# Patient Record
Sex: Male | Born: 1935 | Race: White | Hispanic: No | Marital: Married | Smoking: Never smoker
Health system: Southern US, Community
[De-identification: ages and names within clinical notes are randomized; demographics above are authoritative.]

## PROBLEM LIST (undated history)

## (undated) DIAGNOSIS — I10 Essential (primary) hypertension: Secondary | ICD-10-CM

---

## 2007-10-08 ENCOUNTER — Emergency Department (HOSPITAL_COMMUNITY): Admission: EM | Admit: 2007-10-08 | Discharge: 2007-10-08 | Payer: Self-pay | Admitting: Emergency Medicine

## 2009-04-17 IMAGING — CT CT PELVIS W/O CM
2 of 4 series · 17 of 46 positions shown, 19 images · non-contrast
Comparison: None

CT ABDOMEN

CLINICAL DATA: Right flank pain

CT OF THE ABDOMEN AND PELVIS WITHOUT CONTRAST (CT UROGRAM)
TECHNIQUE: Multidetector CT imaging was performed through the
abdomen and pelvis to include the urinary tract.

[Series 2: stone_wo 5.0 b40f st · axial · 0.67mm/px · z∈[-264,+136]mm · 14 of 110 slices shown, 16 images]
[im 5/110  soft-tissue]
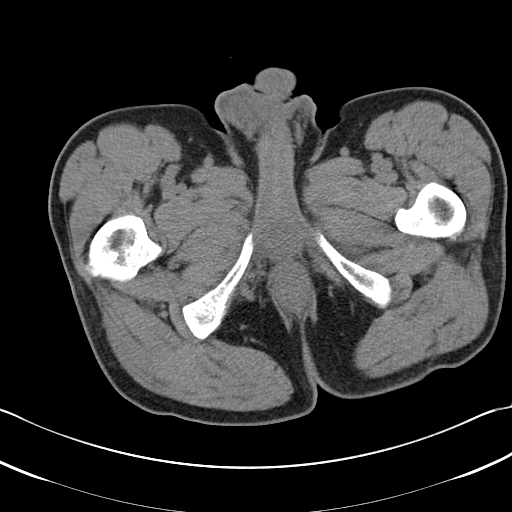
[im 5/110  bone]
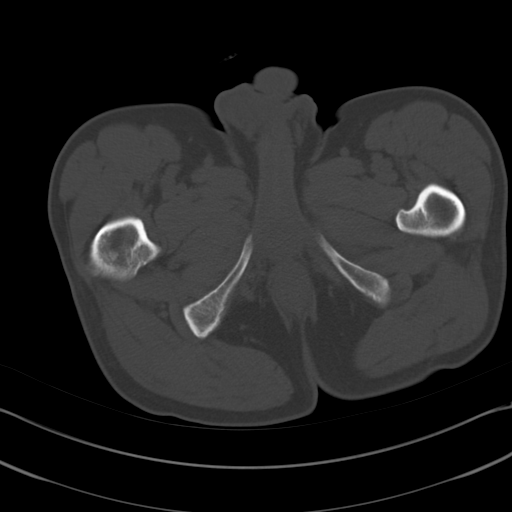
[im 13/110  soft-tissue]
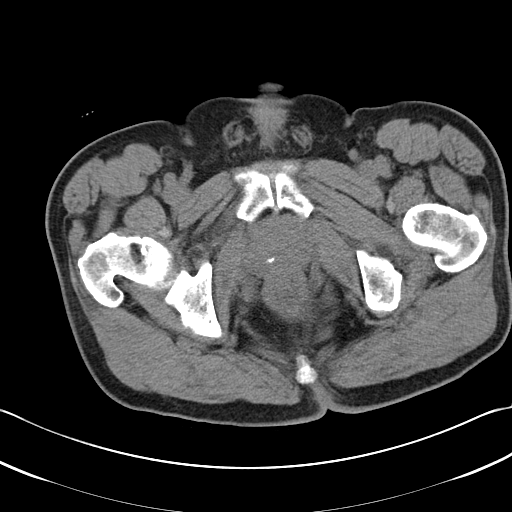
[im 21/110  soft-tissue]
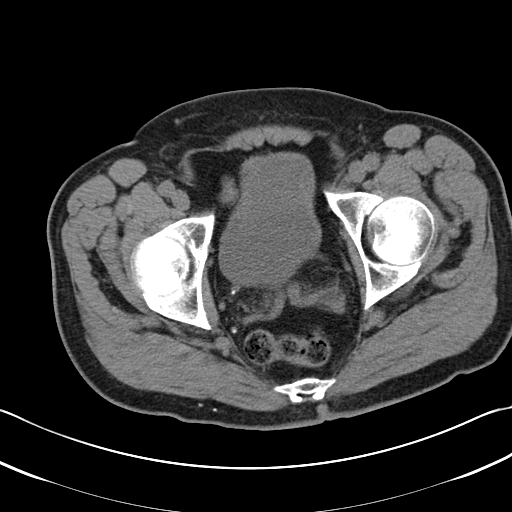
[im 29/110  soft-tissue]
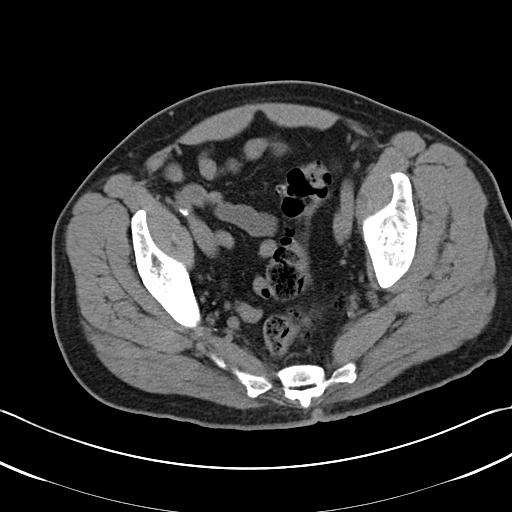
[im 37/110  soft-tissue]
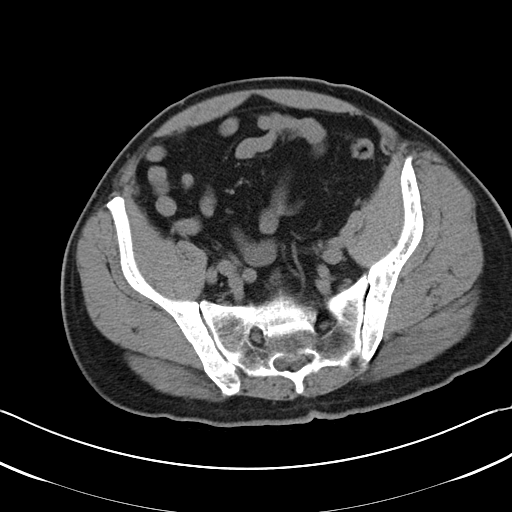
[im 45/110  soft-tissue]
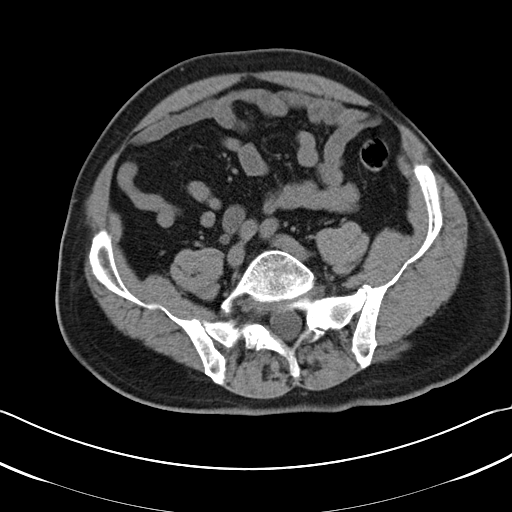
[im 53/110  soft-tissue]
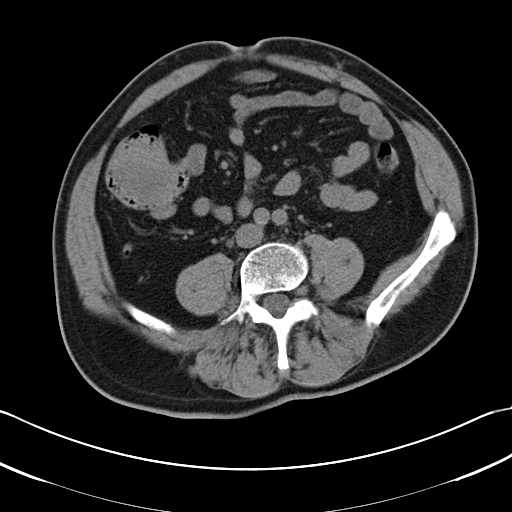
[im 57/110  soft-tissue]
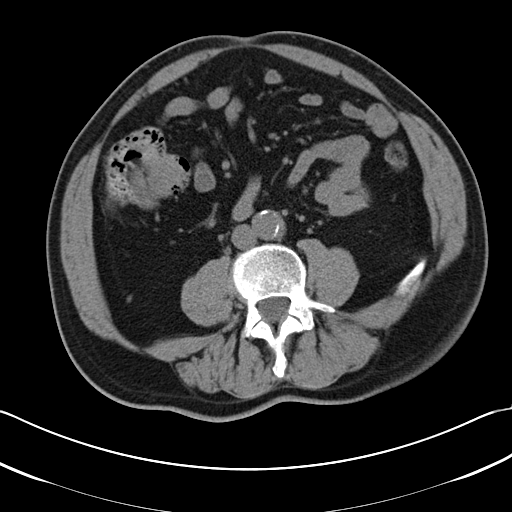
[im 65/110  soft-tissue]
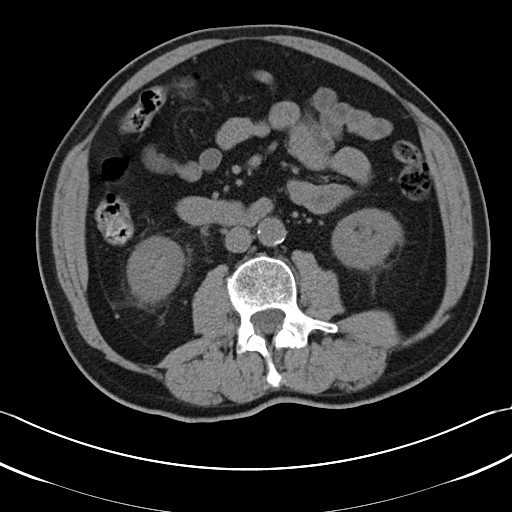
[im 65/110  bone]
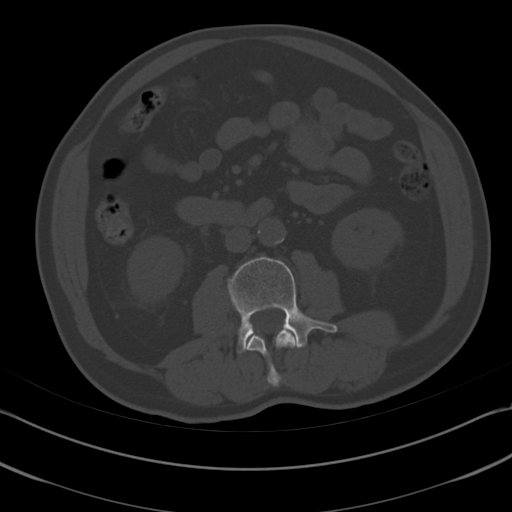
[im 73/110  soft-tissue]
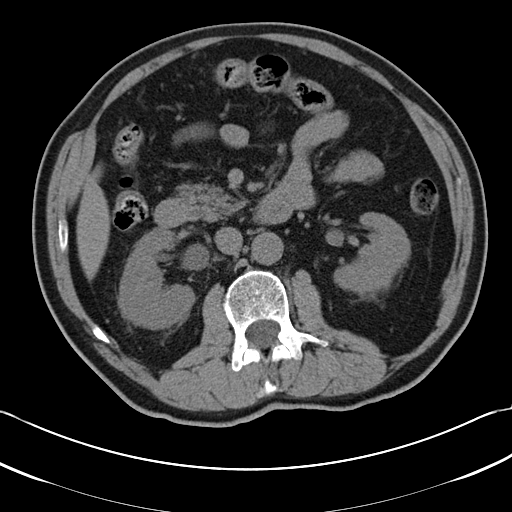
[im 81/110  soft-tissue]
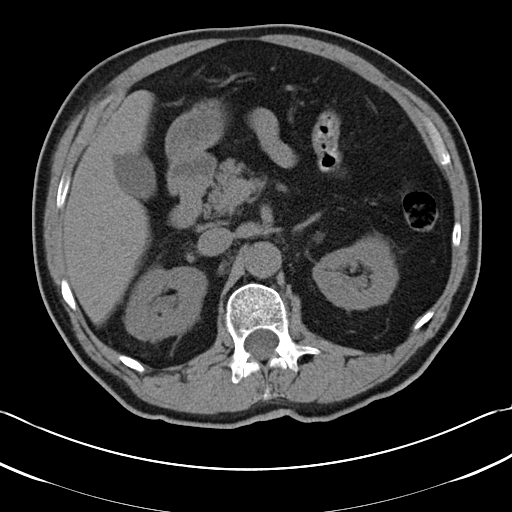
[im 89/110  soft-tissue]
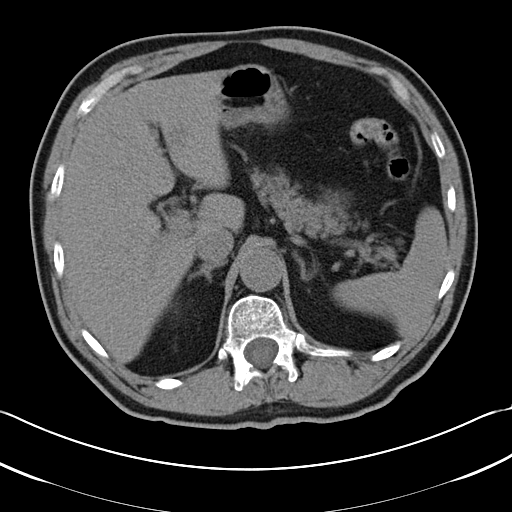
[im 97/110  soft-tissue]
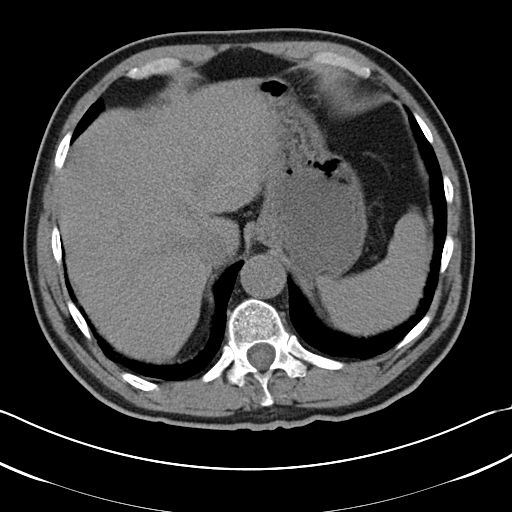
[im 105/110  soft-tissue]
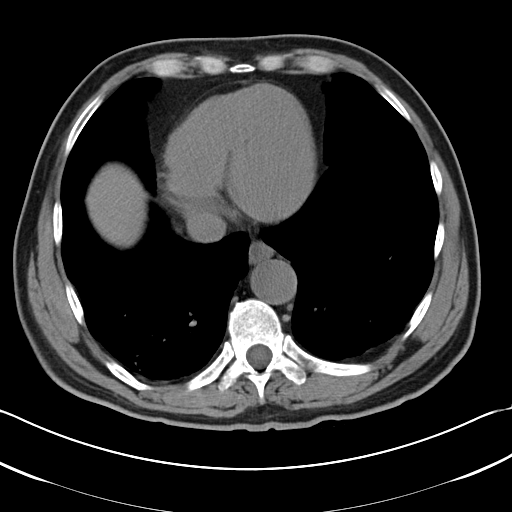

[Series 602: coronal · coronal · 0.89mm/px · 3 of 77 slices shown]
[im 26/77  soft-tissue]
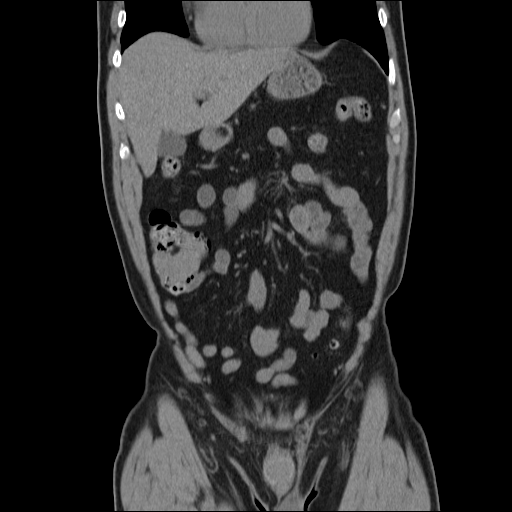
[im 34/77  soft-tissue]
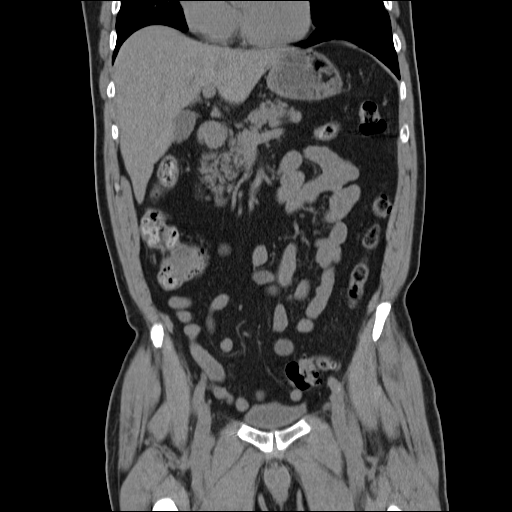
[im 43/77  soft-tissue]
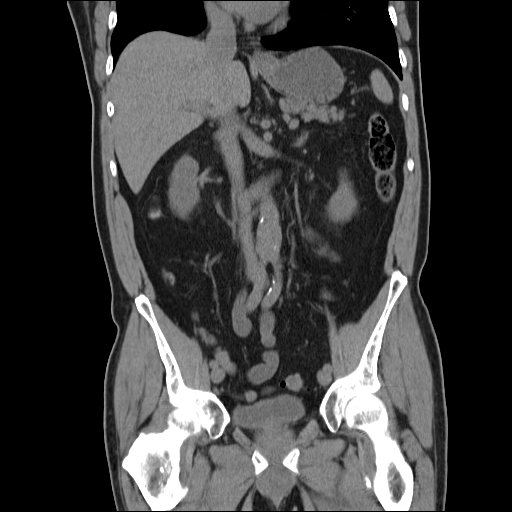

[17 of 46 positions shown; findings below may reference images not displayed]

FINDINGS: There is mild right hydronephrosis.  Perinephric
stranding present.  Small nonobstructing stones in the mid pole of
the right kidney.  No hydronephrosis on the left.  There is mild
perinephric stranding on the left, likely related to remote insult.

Solid organs unremarkable unenhanced appearance.

Linear densities in the lung bases compatible with scarring or
atelectasis.  No effusions.

Gallbladder and bowel grossly unremarkable.  No free fluid, free
air, or adenopathy.
IMPRESSION: Mild right hydronephrosis.  Small nonobstructing right mid pole
stone.  Bilateral perinephric stranding.

CT PELVIS
FINDINGS: There is a 3 mm right UVJ stone.  Right ureter is mildly
dilated to the stone.  No stones on the left.  Urinary bladder
unremarkable.  Appendix is visualized and is normal.  There is
sigmoid diverticulosis.  Small bowel grossly unremarkable. Prostate
mildly enlarged.
IMPRESSION: 3 mm right UVJ stone.

Prostate enlargement.

Sigmoid diverticulosis.

## 2011-02-04 LAB — URINALYSIS, ROUTINE W REFLEX MICROSCOPIC
Ketones, ur: NEGATIVE
Leukocytes, UA: NEGATIVE
Nitrite: NEGATIVE
Specific Gravity, Urine: 1.02
Urobilinogen, UA: 0.2
pH: 6

## 2011-02-04 LAB — POCT I-STAT, CHEM 8
Creatinine, Ser: 1.4
HCT: 45
Hemoglobin: 15.3
Potassium: 4.7
Sodium: 142
TCO2: 25

## 2011-02-04 LAB — CBC
HCT: 44.8
MCV: 91.7
Platelets: 286
RDW: 13.5
WBC: 11 — ABNORMAL HIGH

## 2011-02-04 LAB — DIFFERENTIAL
Basophils Absolute: 0
Basophils Relative: 0
Eosinophils Absolute: 0
Eosinophils Relative: 0
Lymphs Abs: 0.8
Neutrophils Relative %: 89 — ABNORMAL HIGH

## 2013-08-02 ENCOUNTER — Encounter (INDEPENDENT_AMBULATORY_CARE_PROVIDER_SITE_OTHER): Payer: Medicare Other | Admitting: Ophthalmology

## 2013-08-02 DIAGNOSIS — H43819 Vitreous degeneration, unspecified eye: Secondary | ICD-10-CM

## 2013-08-02 DIAGNOSIS — H35379 Puckering of macula, unspecified eye: Secondary | ICD-10-CM

## 2013-08-02 DIAGNOSIS — H5340 Unspecified visual field defects: Secondary | ICD-10-CM

## 2013-08-02 DIAGNOSIS — I10 Essential (primary) hypertension: Secondary | ICD-10-CM

## 2013-08-02 DIAGNOSIS — H35039 Hypertensive retinopathy, unspecified eye: Secondary | ICD-10-CM

## 2013-08-02 DIAGNOSIS — H2510 Age-related nuclear cataract, unspecified eye: Secondary | ICD-10-CM

## 2015-04-23 ENCOUNTER — Other Ambulatory Visit: Payer: Self-pay | Admitting: Family Medicine

## 2015-04-23 DIAGNOSIS — Z87891 Personal history of nicotine dependence: Secondary | ICD-10-CM

## 2015-04-26 ENCOUNTER — Telehealth: Payer: Self-pay | Admitting: Acute Care

## 2015-04-26 NOTE — Telephone Encounter (Signed)
Received paperwork from Dr. Sigmund HazelLisa Miller to schedule the Lung Screening Program. However, because of the patient's age, he will not quailfy for the program.  Left a message on the patient's Voicemail to inform him of the information. Nothing further needed at this time.

## 2016-06-03 ENCOUNTER — Other Ambulatory Visit: Payer: Self-pay | Admitting: Family Medicine

## 2016-06-03 DIAGNOSIS — Z720 Tobacco use: Secondary | ICD-10-CM

## 2016-06-15 ENCOUNTER — Other Ambulatory Visit: Payer: Self-pay | Admitting: Family Medicine

## 2016-06-15 ENCOUNTER — Ambulatory Visit
Admission: RE | Admit: 2016-06-15 | Discharge: 2016-06-15 | Disposition: A | Discharge status: Home / Self Care | Payer: Medicare Other | Source: Ambulatory Visit | Attending: Family Medicine | Admitting: Family Medicine

## 2016-06-15 DIAGNOSIS — Z720 Tobacco use: Secondary | ICD-10-CM

## 2020-06-14 DIAGNOSIS — L600 Ingrowing nail: Secondary | ICD-10-CM | POA: Diagnosis not present

## 2020-06-14 DIAGNOSIS — M79674 Pain in right toe(s): Secondary | ICD-10-CM | POA: Diagnosis not present

## 2020-06-14 DIAGNOSIS — B351 Tinea unguium: Secondary | ICD-10-CM | POA: Diagnosis not present

## 2020-06-14 DIAGNOSIS — B353 Tinea pedis: Secondary | ICD-10-CM | POA: Diagnosis not present

## 2020-06-14 DIAGNOSIS — M79675 Pain in left toe(s): Secondary | ICD-10-CM | POA: Diagnosis not present

## 2020-07-09 DIAGNOSIS — Z0000 Encounter for general adult medical examination without abnormal findings: Secondary | ICD-10-CM | POA: Diagnosis not present

## 2020-07-12 DIAGNOSIS — L600 Ingrowing nail: Secondary | ICD-10-CM | POA: Diagnosis not present

## 2020-07-12 DIAGNOSIS — M79674 Pain in right toe(s): Secondary | ICD-10-CM | POA: Diagnosis not present

## 2020-07-12 DIAGNOSIS — B351 Tinea unguium: Secondary | ICD-10-CM | POA: Diagnosis not present

## 2020-07-12 DIAGNOSIS — M79675 Pain in left toe(s): Secondary | ICD-10-CM | POA: Diagnosis not present

## 2020-08-14 DIAGNOSIS — I10 Essential (primary) hypertension: Secondary | ICD-10-CM | POA: Diagnosis not present

## 2020-08-14 DIAGNOSIS — Z8719 Personal history of other diseases of the digestive system: Secondary | ICD-10-CM | POA: Diagnosis not present

## 2020-08-14 DIAGNOSIS — I714 Abdominal aortic aneurysm, without rupture: Secondary | ICD-10-CM | POA: Diagnosis not present

## 2020-08-14 DIAGNOSIS — I77819 Aortic ectasia, unspecified site: Secondary | ICD-10-CM | POA: Diagnosis not present

## 2020-08-16 DIAGNOSIS — B351 Tinea unguium: Secondary | ICD-10-CM | POA: Diagnosis not present

## 2020-08-16 DIAGNOSIS — M79674 Pain in right toe(s): Secondary | ICD-10-CM | POA: Diagnosis not present

## 2020-08-16 DIAGNOSIS — M79675 Pain in left toe(s): Secondary | ICD-10-CM | POA: Diagnosis not present

## 2020-08-16 DIAGNOSIS — L600 Ingrowing nail: Secondary | ICD-10-CM | POA: Diagnosis not present

## 2020-09-13 DIAGNOSIS — M79675 Pain in left toe(s): Secondary | ICD-10-CM | POA: Diagnosis not present

## 2020-09-13 DIAGNOSIS — B351 Tinea unguium: Secondary | ICD-10-CM | POA: Diagnosis not present

## 2020-09-13 DIAGNOSIS — M79674 Pain in right toe(s): Secondary | ICD-10-CM | POA: Diagnosis not present

## 2020-09-13 DIAGNOSIS — L600 Ingrowing nail: Secondary | ICD-10-CM | POA: Diagnosis not present

## 2020-10-29 DIAGNOSIS — R3914 Feeling of incomplete bladder emptying: Secondary | ICD-10-CM | POA: Diagnosis not present

## 2020-10-29 DIAGNOSIS — R3912 Poor urinary stream: Secondary | ICD-10-CM | POA: Diagnosis not present

## 2020-12-02 DIAGNOSIS — H6122 Impacted cerumen, left ear: Secondary | ICD-10-CM | POA: Diagnosis not present

## 2020-12-06 DIAGNOSIS — I10 Essential (primary) hypertension: Secondary | ICD-10-CM | POA: Diagnosis not present

## 2020-12-13 DIAGNOSIS — L600 Ingrowing nail: Secondary | ICD-10-CM | POA: Diagnosis not present

## 2021-03-14 DIAGNOSIS — M79675 Pain in left toe(s): Secondary | ICD-10-CM | POA: Diagnosis not present

## 2021-03-14 DIAGNOSIS — L600 Ingrowing nail: Secondary | ICD-10-CM | POA: Diagnosis not present

## 2021-03-14 DIAGNOSIS — B351 Tinea unguium: Secondary | ICD-10-CM | POA: Diagnosis not present

## 2021-03-14 DIAGNOSIS — M79674 Pain in right toe(s): Secondary | ICD-10-CM | POA: Diagnosis not present

## 2021-06-04 DIAGNOSIS — H35373 Puckering of macula, bilateral: Secondary | ICD-10-CM | POA: Diagnosis not present

## 2021-06-04 DIAGNOSIS — H5211 Myopia, right eye: Secondary | ICD-10-CM | POA: Diagnosis not present

## 2021-06-04 DIAGNOSIS — H2640 Unspecified secondary cataract: Secondary | ICD-10-CM | POA: Diagnosis not present

## 2021-06-04 DIAGNOSIS — H40013 Open angle with borderline findings, low risk, bilateral: Secondary | ICD-10-CM | POA: Diagnosis not present

## 2021-06-04 DIAGNOSIS — H01003 Unspecified blepharitis right eye, unspecified eyelid: Secondary | ICD-10-CM | POA: Diagnosis not present

## 2021-06-04 DIAGNOSIS — H01006 Unspecified blepharitis left eye, unspecified eyelid: Secondary | ICD-10-CM | POA: Diagnosis not present

## 2021-06-04 DIAGNOSIS — H35033 Hypertensive retinopathy, bilateral: Secondary | ICD-10-CM | POA: Diagnosis not present

## 2021-06-13 DIAGNOSIS — B351 Tinea unguium: Secondary | ICD-10-CM | POA: Diagnosis not present

## 2021-06-13 DIAGNOSIS — M79675 Pain in left toe(s): Secondary | ICD-10-CM | POA: Diagnosis not present

## 2021-06-13 DIAGNOSIS — L600 Ingrowing nail: Secondary | ICD-10-CM | POA: Diagnosis not present

## 2021-06-13 DIAGNOSIS — M79674 Pain in right toe(s): Secondary | ICD-10-CM | POA: Diagnosis not present

## 2021-08-26 DIAGNOSIS — N1831 Chronic kidney disease, stage 3a: Secondary | ICD-10-CM | POA: Diagnosis not present

## 2021-08-26 DIAGNOSIS — I77819 Aortic ectasia, unspecified site: Secondary | ICD-10-CM | POA: Diagnosis not present

## 2021-08-26 DIAGNOSIS — Z8719 Personal history of other diseases of the digestive system: Secondary | ICD-10-CM | POA: Diagnosis not present

## 2021-08-26 DIAGNOSIS — I129 Hypertensive chronic kidney disease with stage 1 through stage 4 chronic kidney disease, or unspecified chronic kidney disease: Secondary | ICD-10-CM | POA: Diagnosis not present

## 2021-09-12 DIAGNOSIS — M79675 Pain in left toe(s): Secondary | ICD-10-CM | POA: Diagnosis not present

## 2021-09-12 DIAGNOSIS — B351 Tinea unguium: Secondary | ICD-10-CM | POA: Diagnosis not present

## 2021-09-12 DIAGNOSIS — M79674 Pain in right toe(s): Secondary | ICD-10-CM | POA: Diagnosis not present

## 2021-09-12 DIAGNOSIS — L600 Ingrowing nail: Secondary | ICD-10-CM | POA: Diagnosis not present

## 2021-10-31 DIAGNOSIS — R3914 Feeling of incomplete bladder emptying: Secondary | ICD-10-CM | POA: Diagnosis not present

## 2022-09-01 DIAGNOSIS — E7800 Pure hypercholesterolemia, unspecified: Secondary | ICD-10-CM | POA: Diagnosis not present

## 2022-09-01 DIAGNOSIS — H9191 Unspecified hearing loss, right ear: Secondary | ICD-10-CM | POA: Diagnosis not present

## 2022-09-01 DIAGNOSIS — Z6825 Body mass index (BMI) 25.0-25.9, adult: Secondary | ICD-10-CM | POA: Diagnosis not present

## 2022-09-01 DIAGNOSIS — I129 Hypertensive chronic kidney disease with stage 1 through stage 4 chronic kidney disease, or unspecified chronic kidney disease: Secondary | ICD-10-CM | POA: Diagnosis not present

## 2022-09-01 DIAGNOSIS — N1831 Chronic kidney disease, stage 3a: Secondary | ICD-10-CM | POA: Diagnosis not present

## 2022-09-01 DIAGNOSIS — I77819 Aortic ectasia, unspecified site: Secondary | ICD-10-CM | POA: Diagnosis not present

## 2022-09-01 DIAGNOSIS — F5101 Primary insomnia: Secondary | ICD-10-CM | POA: Diagnosis not present

## 2022-09-01 DIAGNOSIS — N400 Enlarged prostate without lower urinary tract symptoms: Secondary | ICD-10-CM | POA: Diagnosis not present

## 2022-09-08 ENCOUNTER — Encounter (HOSPITAL_COMMUNITY): Payer: Self-pay

## 2022-09-08 ENCOUNTER — Emergency Department (HOSPITAL_COMMUNITY)
Admission: EM | Admit: 2022-09-08 | Discharge: 2022-09-08 | Disposition: A | Discharge status: Home / Self Care | Payer: PPO | Attending: Emergency Medicine | Admitting: Emergency Medicine

## 2022-09-08 ENCOUNTER — Emergency Department (HOSPITAL_COMMUNITY): Payer: PPO

## 2022-09-08 ENCOUNTER — Other Ambulatory Visit: Payer: Self-pay

## 2022-09-08 DIAGNOSIS — I10 Essential (primary) hypertension: Secondary | ICD-10-CM | POA: Diagnosis not present

## 2022-09-08 DIAGNOSIS — R944 Abnormal results of kidney function studies: Secondary | ICD-10-CM | POA: Insufficient documentation

## 2022-09-08 DIAGNOSIS — K5900 Constipation, unspecified: Secondary | ICD-10-CM

## 2022-09-08 DIAGNOSIS — R109 Unspecified abdominal pain: Secondary | ICD-10-CM | POA: Diagnosis not present

## 2022-09-08 HISTORY — DX: Essential (primary) hypertension: I10

## 2022-09-08 LAB — CBC
HCT: 40.8 % (ref 39.0–52.0)
Hemoglobin: 14 g/dL (ref 13.0–17.0)
MCH: 29.5 pg (ref 26.0–34.0)
MCHC: 34.3 g/dL (ref 30.0–36.0)
MCV: 85.9 fL (ref 80.0–100.0)
Platelets: 269 10*3/uL (ref 150–400)
RBC: 4.75 MIL/uL (ref 4.22–5.81)
RDW: 13.2 % (ref 11.5–15.5)
WBC: 5.9 10*3/uL (ref 4.0–10.5)
nRBC: 0 % (ref 0.0–0.2)

## 2022-09-08 LAB — COMPREHENSIVE METABOLIC PANEL
ALT: 19 U/L (ref 0–44)
AST: 27 U/L (ref 15–41)
Albumin: 3.7 g/dL (ref 3.5–5.0)
Alkaline Phosphatase: 80 U/L (ref 38–126)
Anion gap: 8 (ref 5–15)
BUN: 14 mg/dL (ref 8–23)
CO2: 24 mmol/L (ref 22–32)
Calcium: 9.5 mg/dL (ref 8.9–10.3)
Chloride: 107 mmol/L (ref 98–111)
Creatinine, Ser: 1.32 mg/dL — ABNORMAL HIGH (ref 0.61–1.24)
GFR, Estimated: 53 mL/min — ABNORMAL LOW (ref >60)
Glucose, Bld: 107 mg/dL — ABNORMAL HIGH (ref 70–99)
Potassium: 4.3 mmol/L (ref 3.5–5.1)
Sodium: 139 mmol/L (ref 135–145)
Total Bilirubin: 0.7 mg/dL (ref 0.3–1.2)
Total Protein: 7.6 g/dL (ref 6.5–8.1)

## 2022-09-08 LAB — MAGNESIUM: Magnesium: 2.2 mg/dL (ref 1.7–2.4)

## 2022-09-08 MED ORDER — POLYETHYLENE GLYCOL 3350 17 G PO PACK
17.0000 g | PACK | Freq: Every day | ORAL | Status: DC
  Administered 2022-09-08: 17 g via ORAL
  Filled 2022-09-08: qty 1

## 2022-09-08 MED ORDER — BISACODYL 10 MG RE SUPP: 10.0000 mg | RECTAL | 0 refills | Status: DC | PRN

## 2022-09-08 MED ORDER — FLEET ENEMA 7-19 GM/118ML RE ENEM
1.0000 | ENEMA | Freq: Once | RECTAL | Status: AC
  Administered 2022-09-08: 1 via RECTAL
  Filled 2022-09-08: qty 1

## 2022-09-08 MED ORDER — POLYETHYLENE GLYCOL 3350 17 G PO PACK: 17.0000 g | PACK | Freq: Every day | ORAL | 0 refills | Status: DC

## 2022-09-08 MED ORDER — BISACODYL 10 MG RE SUPP: 10.0000 mg | RECTAL | 0 refills | Status: AC | PRN

## 2022-09-08 MED ORDER — POLYETHYLENE GLYCOL 3350 17 G PO PACK: 17.0000 g | PACK | Freq: Every day | ORAL | 0 refills | Status: AC

## 2022-09-08 NOTE — ED Triage Notes (Signed)
Pt to er, pt states that he is here for some constipation, states that he can't remember when he last pooped, states that it has been at least 10 days.  States that he took a laxative yesterday without relief.

## 2022-09-08 NOTE — Discharge Instructions (Signed)
You were seen for your constipation in the emergency department.   At home, please take the MiraLAX daily until you have regular bowel movements and use the suppositories every other day until you start having regular bowel movements.    Check your MyChart online for the results of any tests that had not resulted by the time you left the emergency department.   Follow-up with your primary doctor in 2-3 days regarding your visit.    Return immediately to the emergency department if you experience any of the following: Severe abdominal pain, vomiting, or any other concerning symptoms.    Thank you for visiting our Emergency Department. It was a pleasure taking care of you today.

## 2022-09-08 NOTE — ED Provider Notes (Signed)
Brian Mckee AT [facility] Provider Note   CSN: [id number] Arrival date & time: 09/08/22  3220     History  Chief Complaint  Patient presents with   Constipation    Brian Mckee is a 88 y.o. male.  88 year old male with a history of hypertension presents to the emergency [facility] with constipation.  Reports that over the past 2 weeks he has had significant constipation.  Says that he has had occasional small-volume watery stools.  No nausea or vomiting or abdominal pain.  No weight loss or history of cancer.  Has been passing gas but feels it is slightly less than usual.  No abdominal surgeries.       Home Medications Prior to Admission medications   Medication Sig Start Date End Date Taking? Authorizing Provider  bisacodyl (DULCOLAX) 10 MG suppository Place 1 suppository (10 mg total) rectally as needed for moderate constipation. 09/08/22   Rondel Baton, MD  polyethylene glycol (MIRALAX) 17 g packet Take 17 g by mouth daily. 09/08/22   Rondel Baton, MD      Allergies    Patient has no known allergies.    Review of Systems   Review of Systems  Physical Exam Updated Vital Signs BP (!) 177/93 (BP Location: Left Arm)   Pulse 78   Temp 98.1 F (36.7 C) (Oral)   Resp 15   Ht 5\' 4"  (1.626 m)   Wt 68 kg   SpO2 97%   BMI 25.75 kg/m  Physical Exam Vitals and nursing note reviewed.  Constitutional:      General: He is not in acute distress.    Appearance: He is well-developed.  HENT:     Head: Normocephalic and atraumatic.     Right Ear: External ear normal.     Left Ear: External ear normal.     Nose: Nose normal.  Eyes:     Extraocular Movements: Extraocular movements intact.     Conjunctiva/sclera: Conjunctivae normal.     Pupils: Pupils are equal, round, and reactive to light.  Cardiovascular:     Rate and Rhythm: Normal rate and regular rhythm.  Pulmonary:     Effort: Pulmonary effort is normal. No respiratory  distress.  Abdominal:     General: There is no distension.     Palpations: Abdomen is soft. There is no mass.     Tenderness: There is no abdominal tenderness. There is no guarding.  Musculoskeletal:     Cervical back: Normal range of motion and neck supple.  Skin:    General: Skin is warm and dry.  Neurological:     Mental Status: He is alert. Mental status is at baseline.  Psychiatric:        Mood and Affect: Mood normal.        Behavior: Behavior normal.     ED Results / Procedures / Treatments   Labs (all labs ordered are listed, but only abnormal results are displayed) Labs Reviewed  COMPREHENSIVE METABOLIC PANEL - Abnormal; Notable for the following components:      Result Value   Glucose, Bld 107 (*)    Creatinine, Ser 1.32 (*)    GFR, Estimated 53 (*)    All other components within normal limits  MAGNESIUM  CBC    EKG None  Radiology DG Abdomen 1 View  Result Date: 09/08/2022 CLINICAL DATA:  88 year old male with no bowel movement in 10 days. EXAM: ABDOMEN - 1 VIEW COMPARISON:  CT  Abdomen and Pelvis 10/08/2007. FINDINGS: Upright AP view of the abdomen 0850 hours. No evidence of pneumoperitoneum. Lung bases appear stable since 2009. No dilated loops are evident. There are small fluid levels in the upper abdomen in what is probably the transverse colon and both flexures. Nonobstructed pattern overall. No significant retained stool in the abdomen. Partially visible pelvis probably with mild rectal retained stool. Other abdominal visceral contours appear normal. Lumbar spine degeneration in the setting of scoliosis. No acute osseous abnormality identified. IMPRESSION: Overall nonobstructed bowel-gas pattern with no significant retained stool in the abdomen. But small air-fluid levels in what appears to be the transverse colon, hepatic flexure may indicate diarrhea or enema use. Electronically Signed   By: Odessa Fleming M.D.   On: 09/08/2022 08:56    Procedures Procedures    Medications Ordered in ED Medications  polyethylene glycol (MIRALAX / GLYCOLAX) packet 17 g (17 g Oral Given 09/08/22 0922)  sodium phosphate (FLEET) 7-19 GM/118ML enema 1 enema (1 enema Rectal Given 09/08/22 0930)    ED Course/ Medical Decision Making/ A&P Clinical Course as of 09/08/22 1101  Tue Sep 08, 2022  0945 Creatinine(!): 1.32 At baseline [RP]    Clinical Course User Index [RP] Rondel Baton, MD                             Medical Decision Making Amount and/or Complexity of Data Reviewed Labs: ordered. Decision-making details documented in ED Course. Radiology: ordered.  Risk OTC drugs.   Brian Mckee is a 88 y.o. male with comorbidities that complicate the patient evaluation including hypertension who presents emergency [facility] with constipation  Initial Ddx:  Constipation, bowel obstruction, ileus, electrolyte abnormality  MDM:  Feel the patient likely has constipation based on his symptoms.  No significant signs of a bowel obstruction or ileus including nausea or vomiting or abdominal pain.  Will check labs to assess for any electrolyte abnormality.  Will also obtain abdominal x-ray to assess for obstruction but feel this is less likely.  Plan:  Labs Abdominal x-ray MiraLAX Enema  ED Summary/Re-evaluation:  Patient went the above workup and x-ray did not show signs of obstructive pattern.  Patient did not develop any abdominal pain or nausea or vomiting while in the emergency [facility].  Had an enema but was unable to have a bowel movement.  At that time did offer patient CT scan but he states that he prefers to go home with a bowel regimen and return to the emergency [facility] should his symptoms not improve.  Feel this is reasonable.  Will also have him follow-up with his primary doctor in several days.   This patient presents to the ED for concern of complaints listed in HPI, this involves an extensive number of treatment options, and is a  complaint that carries with it a high risk of complications and morbidity. Disposition including potential need for admission considered.   Dispo: DC Home. Return precautions discussed including, but not limited to, those listed in the AVS. Allowed pt time to ask questions which were answered fully prior to dc.  Records reviewed Outpatient Clinic Notes The following labs were independently interpreted: Chemistry and show no acute abnormality I independently reviewed the following imaging with scope of interpretation limited to determining acute life threatening conditions related to emergency care:  Abdominal x-ray  and agree with the radiologist interpretation with the following exceptions: none I personally reviewed and interpreted cardiac monitoring:  normal sinus rhythm  I have reviewed the patients home medications and made adjustments as needed Social Determinants of health:  Elderly   Final Clinical Impression(s) / ED Diagnoses Final diagnoses:  Constipation, unspecified constipation type    Rx / DC Orders ED Discharge Orders          Ordered    polyethylene glycol (MIRALAX) 17 g packet  Daily,   Status:  Discontinued        09/08/22 1031    bisacodyl (DULCOLAX) 10 MG suppository  As needed,   Status:  Discontinued        09/08/22 1031    bisacodyl (DULCOLAX) 10 MG suppository  As needed        09/08/22 1031    polyethylene glycol (MIRALAX) 17 g packet  Daily        09/08/22 1031              Rondel Baton, MD 09/08/22 1101

## 2022-09-21 DIAGNOSIS — Z6826 Body mass index (BMI) 26.0-26.9, adult: Secondary | ICD-10-CM | POA: Diagnosis not present

## 2022-09-21 DIAGNOSIS — Z0000 Encounter for general adult medical examination without abnormal findings: Secondary | ICD-10-CM | POA: Diagnosis not present

## 2022-09-21 DIAGNOSIS — I10 Essential (primary) hypertension: Secondary | ICD-10-CM | POA: Diagnosis not present

## 2022-11-04 DIAGNOSIS — N401 Enlarged prostate with lower urinary tract symptoms: Secondary | ICD-10-CM | POA: Diagnosis not present

## 2022-11-04 DIAGNOSIS — R3912 Poor urinary stream: Secondary | ICD-10-CM | POA: Diagnosis not present

## 2022-12-10 DIAGNOSIS — H40013 Open angle with borderline findings, low risk, bilateral: Secondary | ICD-10-CM | POA: Diagnosis not present

## 2022-12-10 DIAGNOSIS — Z961 Presence of intraocular lens: Secondary | ICD-10-CM | POA: Diagnosis not present

## 2022-12-28 DIAGNOSIS — I10 Essential (primary) hypertension: Secondary | ICD-10-CM | POA: Diagnosis not present

## 2023-01-12 DIAGNOSIS — I129 Hypertensive chronic kidney disease with stage 1 through stage 4 chronic kidney disease, or unspecified chronic kidney disease: Secondary | ICD-10-CM | POA: Diagnosis not present

## 2023-03-22 DIAGNOSIS — Z6826 Body mass index (BMI) 26.0-26.9, adult: Secondary | ICD-10-CM | POA: Diagnosis not present

## 2023-03-22 DIAGNOSIS — I10 Essential (primary) hypertension: Secondary | ICD-10-CM | POA: Diagnosis not present

## 2023-09-27 DIAGNOSIS — Z1331 Encounter for screening for depression: Secondary | ICD-10-CM | POA: Diagnosis not present

## 2023-09-27 DIAGNOSIS — N400 Enlarged prostate without lower urinary tract symptoms: Secondary | ICD-10-CM | POA: Diagnosis not present

## 2023-09-27 DIAGNOSIS — I129 Hypertensive chronic kidney disease with stage 1 through stage 4 chronic kidney disease, or unspecified chronic kidney disease: Secondary | ICD-10-CM | POA: Diagnosis not present

## 2023-09-27 DIAGNOSIS — N1831 Chronic kidney disease, stage 3a: Secondary | ICD-10-CM | POA: Diagnosis not present

## 2023-09-27 DIAGNOSIS — I714 Abdominal aortic aneurysm, without rupture, unspecified: Secondary | ICD-10-CM | POA: Diagnosis not present

## 2023-09-27 DIAGNOSIS — Z6825 Body mass index (BMI) 25.0-25.9, adult: Secondary | ICD-10-CM | POA: Diagnosis not present

## 2023-09-27 DIAGNOSIS — H9113 Presbycusis, bilateral: Secondary | ICD-10-CM | POA: Diagnosis not present

## 2023-09-27 DIAGNOSIS — E7800 Pure hypercholesterolemia, unspecified: Secondary | ICD-10-CM | POA: Diagnosis not present

## 2023-09-27 DIAGNOSIS — Z0000 Encounter for general adult medical examination without abnormal findings: Secondary | ICD-10-CM | POA: Diagnosis not present

## 2023-09-27 DIAGNOSIS — Z8719 Personal history of other diseases of the digestive system: Secondary | ICD-10-CM | POA: Diagnosis not present

## 2023-11-05 DIAGNOSIS — R3912 Poor urinary stream: Secondary | ICD-10-CM | POA: Diagnosis not present

## 2023-11-05 DIAGNOSIS — N401 Enlarged prostate with lower urinary tract symptoms: Secondary | ICD-10-CM | POA: Diagnosis not present

## 2023-12-23 DIAGNOSIS — H40013 Open angle with borderline findings, low risk, bilateral: Secondary | ICD-10-CM | POA: Diagnosis not present

## 2023-12-23 DIAGNOSIS — Z961 Presence of intraocular lens: Secondary | ICD-10-CM | POA: Diagnosis not present

## 2023-12-23 DIAGNOSIS — H16223 Keratoconjunctivitis sicca, not specified as Sjogren's, bilateral: Secondary | ICD-10-CM | POA: Diagnosis not present
